# Patient Record
Sex: Female | Born: 1960 | Race: White | Hispanic: No | Marital: Married
Health system: Southern US, Community
[De-identification: ages and names within clinical notes are randomized; demographics above are authoritative.]

## PROBLEM LIST (undated history)

## (undated) DIAGNOSIS — C801 Malignant (primary) neoplasm, unspecified: Secondary | ICD-10-CM

## (undated) HISTORY — PX: VASCULAR SURGERY: SHX849

---

## 2017-02-03 ENCOUNTER — Emergency Department (HOSPITAL_COMMUNITY)
Admission: EM | Admit: 2017-02-03 | Discharge: 2017-02-03 | Disposition: A | Discharge status: Home / Self Care | Payer: Self-pay | Attending: Emergency Medicine | Admitting: Emergency Medicine

## 2017-02-03 ENCOUNTER — Encounter (HOSPITAL_COMMUNITY): Payer: Self-pay | Admitting: Emergency Medicine

## 2017-02-03 ENCOUNTER — Emergency Department (HOSPITAL_COMMUNITY): Payer: Self-pay

## 2017-02-03 DIAGNOSIS — Z859 Personal history of malignant neoplasm, unspecified: Secondary | ICD-10-CM | POA: Insufficient documentation

## 2017-02-03 DIAGNOSIS — Y929 Unspecified place or not applicable: Secondary | ICD-10-CM | POA: Insufficient documentation

## 2017-02-03 DIAGNOSIS — Y999 Unspecified external cause status: Secondary | ICD-10-CM | POA: Insufficient documentation

## 2017-02-03 DIAGNOSIS — Y939 Activity, unspecified: Secondary | ICD-10-CM | POA: Insufficient documentation

## 2017-02-03 DIAGNOSIS — S0083XA Contusion of other part of head, initial encounter: Secondary | ICD-10-CM

## 2017-02-03 DIAGNOSIS — S022XXA Fracture of nasal bones, initial encounter for closed fracture: Secondary | ICD-10-CM | POA: Insufficient documentation

## 2017-02-03 HISTORY — DX: Malignant (primary) neoplasm, unspecified: C80.1

## 2017-02-03 MED ORDER — BACITRACIN ZINC 500 UNIT/GM EX OINT: TOPICAL_OINTMENT | Freq: Two times a day (BID) | CUTANEOUS | Status: DC

## 2017-02-03 MED ORDER — TETANUS-DIPHTH-ACELL PERTUSSIS 5-2.5-18.5 LF-MCG/0.5 IM SUSP
0.5000 mL | Freq: Once | INTRAMUSCULAR | Status: AC
  Administered 2017-02-03: 0.5 mL via INTRAMUSCULAR
  Filled 2017-02-03: qty 0.5

## 2017-02-03 MED ORDER — DICLOFENAC SODIUM 50 MG PO TBEC: 50.0000 mg | DELAYED_RELEASE_TABLET | Freq: Two times a day (BID) | ORAL | 0 refills | Status: AC

## 2017-02-03 NOTE — Discharge Instructions (Signed)
Take the medication as needed for pain. Follow up with ENT. Apply ice to the injured area as directed in the instructions. Return for worsening symptoms.

## 2017-02-03 NOTE — ED Notes (Signed)
See EDP assessment 
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
## 2017-02-03 NOTE — ED Notes (Addendum)
Pt is asking for h2o & ice chips.
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
